# Patient Record
Sex: Female | Born: 1995 | Race: Black or African American | Hispanic: No | Marital: Single | Smoking: Never smoker
Health system: Southern US, Community
[De-identification: ages and names within clinical notes are randomized; demographics above are authoritative.]

## PROBLEM LIST (undated history)

## (undated) HISTORY — PX: WRIST SURGERY: SHX841

---
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
## 2016-01-02 ENCOUNTER — Encounter (HOSPITAL_COMMUNITY): Payer: Self-pay | Admitting: Emergency Medicine

## 2016-01-02 ENCOUNTER — Ambulatory Visit (HOSPITAL_COMMUNITY)
Admission: EM | Admit: 2016-01-02 | Discharge: 2016-01-02 | Disposition: A | Discharge status: Home / Self Care | Payer: PRIVATE HEALTH INSURANCE | Attending: Family Medicine | Admitting: Family Medicine

## 2016-01-02 DIAGNOSIS — J329 Chronic sinusitis, unspecified: Secondary | ICD-10-CM

## 2016-01-02 DIAGNOSIS — J069 Acute upper respiratory infection, unspecified: Secondary | ICD-10-CM

## 2016-01-02 DIAGNOSIS — R51 Headache: Secondary | ICD-10-CM

## 2016-01-02 DIAGNOSIS — R519 Headache, unspecified: Secondary | ICD-10-CM

## 2016-01-02 MED ORDER — FLUTICASONE PROPIONATE 50 MCG/ACT NA SUSP: 2.0000 | Freq: Every day | NASAL | 2 refills | Status: AC

## 2016-01-02 MED ORDER — AMOXICILLIN 500 MG PO CAPS: 500.0000 mg | ORAL_CAPSULE | Freq: Three times a day (TID) | ORAL | 0 refills | Status: AC

## 2016-01-02 NOTE — ED Triage Notes (Signed)
[facility] a [facility] pain and pressure and a headache x 3 days.

## 2016-01-03 NOTE — ED Provider Notes (Signed)
CSN: [id number]     Arrival date & time 01/02/16  1307 History   First MD Initiated Contact with Patient 01/02/16 1428     Chief Complaint  Patient presents with  . Facial Pain  . Headache   (Consider location/radiation/quality/duration/timing/severity/associated sxs/prior Treatment) HPI 20 y/o female with several day hx of URI, sore throat sxs. Also with sinus pressure, pain and drainage. No relief with OTC meds.  History reviewed. No pertinent past medical history. Past Surgical History:  Procedure Laterality Date  . WRIST SURGERY     History reviewed. No pertinent family history. Social History  Substance Use Topics  . Smoking status: Never Smoker  . Smokeless tobacco: Never Used  . Alcohol use No   OB History    No data available     Review of Systems  Denies: HEADACHE, NAUSEA, ABDOMINAL PAIN, CHEST PAIN, CONGESTION, DYSURIA, SHORTNESS OF BREATH  Allergies  Review of patient's allergies indicates no known allergies.  Home Medications   Prior to Admission medications   Medication Sig Start Date End Date Taking? Authorizing Provider  norelgestromin-ethinyl estradiol (ORTHO EVRA) 150-35 MCG/24HR transdermal patch Place 1 patch onto the skin once a week.   Yes Historical Provider, MD  amoxicillin (AMOXIL) 500 MG capsule Take 1 capsule (500 mg total) by mouth 3 (three) times daily. 01/02/16   Tharon AquasFrank C Rawn Quiroa, PA  fluticasone (FLONASE) 50 MCG/ACT nasal spray Place 2 sprays into both nostrils daily. 01/02/16   Tharon AquasFrank C Uva Runkel, PA  ipratropium (ATROVENT) 0.06 % nasal spray Place 2 sprays into both nostrils 4 (four) times daily. 06/01/15   Linna HoffJames D Kindl, MD   Meds Ordered and Administered this Visit  Medications - No data to display  BP 119/85 (BP Location: Left Arm)   Pulse 107   Temp 99.6 F (37.6 C) (Oral)   Resp 12   LMP 12/17/2015 (Exact Date)   SpO2 99%  No data found.   Physical Exam NURSES NOTES AND VITAL SIGNS REVIEWED. CONSTITUTIONAL: Well developed, well  nourished, no acute distress HEENT: normocephalic, atraumatic EYES: Conjunctiva normal NECK:normal ROM, supple, no adenopathy PULMONARY:No respiratory distress, normal effort ABDOMINAL: Soft, ND, NT BS+, No CVAT MUSCULOSKELETAL: Normal ROM of all extremities,  SKIN: warm and dry without rash PSYCHIATRIC: Mood and affect, behavior are normal  Urgent Care Course   Clinical Course    Procedures (including critical care time)  Labs Review Labs Reviewed - No data to display  Imaging Review No results found.   Visual Acuity Review  Right Eye Distance:   Left Eye Distance:   Bilateral Distance:    Right Eye Near:   Left Eye Near:    Bilateral Near:         MDM   1. URI (upper respiratory infection)   2. Sinus headache   3. Sinusitis, unspecified chronicity, unspecified location     Patient is reassured that there are no issues that require transfer to higher level of care at this time or additional tests. Patient is advised to continue home symptomatic treatment. Patient is advised that if there are new or worsening symptoms to attend the emergency department, contact primary care provider, or return to UC. Instructions of care provided discharged home in stable condition.    THIS NOTE WAS GENERATED USING A VOICE RECOGNITION SOFTWARE PROGRAM. ALL REASONABLE EFFORTS  WERE MADE TO PROOFREAD THIS DOCUMENT FOR ACCURACY.  I have verbally reviewed the discharge instructions with the patient. A printed AVS was given to the patient.  All questions were answered prior to discharge.      Tharon Aquas, PA 01/03/16 1017

## 2016-08-30 ENCOUNTER — Ambulatory Visit
Admission: RE | Admit: 2016-08-30 | Discharge: 2016-08-30 | Disposition: A | Discharge status: Home / Self Care | Payer: No Typology Code available for payment source | Source: Ambulatory Visit | Attending: Internal Medicine | Admitting: Internal Medicine

## 2016-08-30 ENCOUNTER — Other Ambulatory Visit: Payer: Self-pay | Admitting: Internal Medicine

## 2016-08-30 DIAGNOSIS — Z111 Encounter for screening for respiratory tuberculosis: Secondary | ICD-10-CM

## 2018-12-13 IMAGING — CR DG CHEST 1V
1 series · 1 of 1 positions shown · non-contrast
Comparison: None.

CLINICAL DATA: 20-year-old asymptomatic female with positive PPD.

EXAM:
CHEST 1 VIEW

[w chest pa]
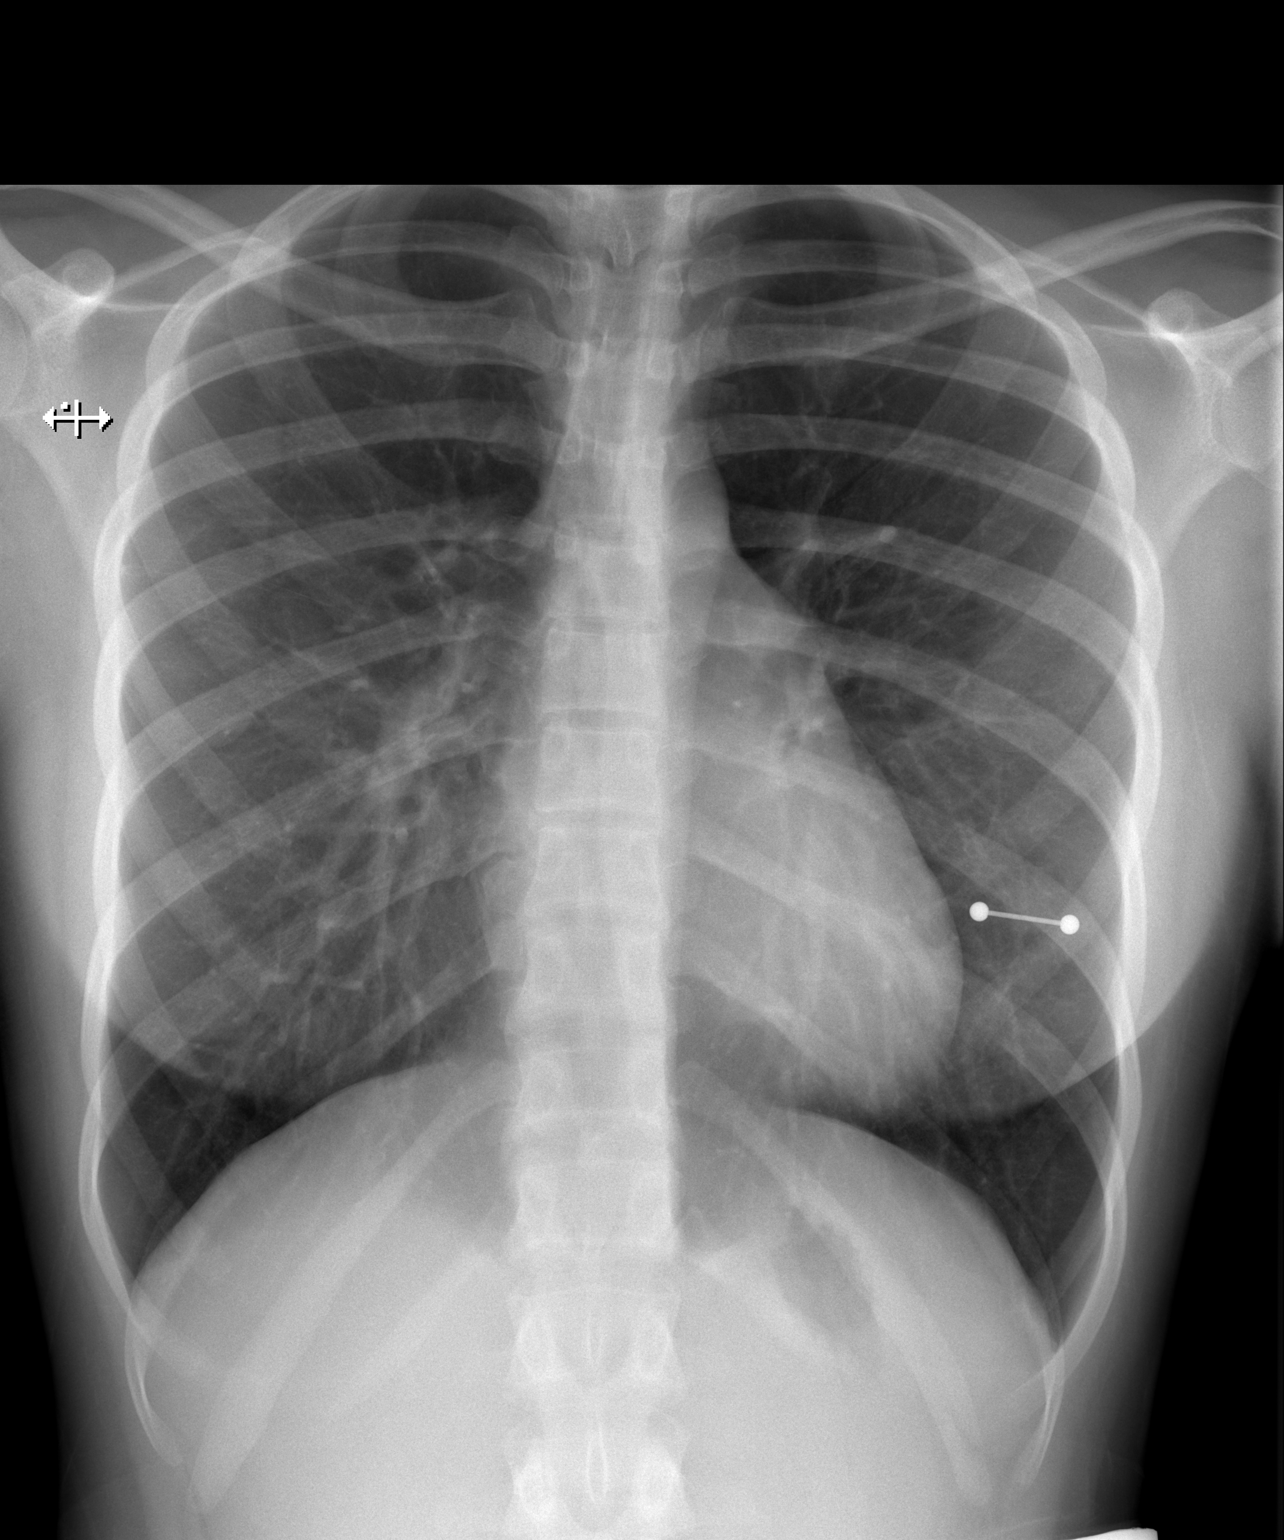

[1 of 1 positions shown; findings below may reference images not displayed]

FINDINGS: PA view of the chest. Lung volumes compatible with good inspiratory
effort. Normal mediastinal contours. The lungs are clear. No pleural
effusion. Visualized tracheal air column is within normal limits.
Slight dextroconvex thoracic spine curvature. No other osseous
abnormality. Negative visible bowel gas pattern.
IMPRESSION: Negative.  No acute cardiopulmonary abnormality.
